# Patient Record
Sex: Female | Born: 1980 | Race: Black or African American | Hispanic: No | Marital: Single | Smoking: Never smoker
Health system: Southern US, Community
[De-identification: ages and names within clinical notes are randomized; demographics above are authoritative.]

## PROBLEM LIST (undated history)

## (undated) DIAGNOSIS — T7840XA Allergy, unspecified, initial encounter: Secondary | ICD-10-CM

## (undated) DIAGNOSIS — F419 Anxiety disorder, unspecified: Secondary | ICD-10-CM

## (undated) HISTORY — DX: Allergy, unspecified, initial encounter: T78.40XA

## (undated) HISTORY — DX: Anxiety disorder, unspecified: F41.9

---

## 2004-10-28 ENCOUNTER — Emergency Department (HOSPITAL_COMMUNITY): Admission: EM | Admit: 2004-10-28 | Discharge: 2004-10-28 | Payer: Self-pay | Admitting: Emergency Medicine

## 2004-11-06 ENCOUNTER — Emergency Department (HOSPITAL_COMMUNITY): Admission: EM | Admit: 2004-11-06 | Discharge: 2004-11-06 | Payer: Self-pay | Admitting: Family Medicine

## 2009-04-10 ENCOUNTER — Emergency Department (HOSPITAL_COMMUNITY)
Admission: EM | Admit: 2009-04-10 | Discharge: 2009-04-10 | Payer: Self-pay | Source: Home / Self Care | Admitting: Family Medicine

## 2011-10-30 ENCOUNTER — Other Ambulatory Visit: Payer: Self-pay | Admitting: Obstetrics & Gynecology

## 2011-10-30 DIAGNOSIS — N63 Unspecified lump in unspecified breast: Secondary | ICD-10-CM

## 2011-10-30 DIAGNOSIS — Z803 Family history of malignant neoplasm of breast: Secondary | ICD-10-CM

## 2011-10-30 DIAGNOSIS — N644 Mastodynia: Secondary | ICD-10-CM

## 2011-11-02 ENCOUNTER — Ambulatory Visit
Admission: RE | Admit: 2011-11-02 | Discharge: 2011-11-02 | Disposition: A | Discharge status: Home / Self Care | Payer: 59 | Source: Ambulatory Visit | Attending: Obstetrics & Gynecology | Admitting: Obstetrics & Gynecology

## 2011-11-02 DIAGNOSIS — N63 Unspecified lump in unspecified breast: Secondary | ICD-10-CM

## 2011-11-02 DIAGNOSIS — Z803 Family history of malignant neoplasm of breast: Secondary | ICD-10-CM

## 2011-11-02 DIAGNOSIS — N644 Mastodynia: Secondary | ICD-10-CM

## 2012-11-11 ENCOUNTER — Other Ambulatory Visit: Payer: Self-pay

## 2012-11-11 DIAGNOSIS — Z1231 Encounter for screening mammogram for malignant neoplasm of breast: Secondary | ICD-10-CM

## 2012-11-25 ENCOUNTER — Ambulatory Visit
Admission: RE | Admit: 2012-11-25 | Discharge: 2012-11-25 | Disposition: A | Discharge status: Home / Self Care | Payer: 59 | Source: Ambulatory Visit

## 2012-11-25 DIAGNOSIS — Z1231 Encounter for screening mammogram for malignant neoplasm of breast: Secondary | ICD-10-CM

## 2015-04-01 ENCOUNTER — Ambulatory Visit (INDEPENDENT_AMBULATORY_CARE_PROVIDER_SITE_OTHER): Payer: 59 | Admitting: Urgent Care

## 2015-04-01 ENCOUNTER — Ambulatory Visit (INDEPENDENT_AMBULATORY_CARE_PROVIDER_SITE_OTHER): Payer: 59

## 2015-04-01 VITALS — BP 118/80 | HR 99 | Temp 98.5°F | Resp 16 | Ht 69.0 in | Wt 142.0 lb

## 2015-04-01 DIAGNOSIS — R0602 Shortness of breath: Secondary | ICD-10-CM | POA: Diagnosis not present

## 2015-04-01 DIAGNOSIS — F419 Anxiety disorder, unspecified: Secondary | ICD-10-CM

## 2015-04-01 NOTE — Progress Notes (Signed)
    MRN: [id number] DOB: 1980/09/26  Subjective:   Joan Lane is a 35 y.o. female presenting for chief complaint of Shortness of Breath  Reports 2 week history of difficulty breathing, shortness of breath, throat congestion, throat tightness. Has not tried any medications. However, patient has had some temporary relief with belching. Admits history of seasonal allergies but has not tried this recently. Also has a history of anxiety but is not on any medical therapy, has not seen a therapist of psychiatrist. She also admits that her shob may be related to anxiety but cannot identify any triggers as her shob occurs randomly. Patient's mother reports that she thinks her anxiety is worse than patient admits. Denies fever, chest pain, wheezing, cough, sore throat, n/v, abdominal pain. Denies smoking cigarettes, history of asthma.  Joan Lane currently has no medications in their medication list. Also is allergic to avelox.  Joan Lane  has a past medical history of Allergy and Anxiety. Also  has no past surgical history on file.  Denies family history of lung cancer, lung disease, sarcoidosis.  Objective:   Vitals: BP 118/80 mmHg  Pulse 99  Temp(Src) 98.5 F (36.9 C) (Oral)  Resp 16  Ht 5\' 9"  (1.753 m)  Wt 142 lb (64.411 kg)  BMI 20.96 kg/m2  SpO2 98%  PF 400 L/min  LMP 03/21/2015 (Exact Date)  Physical Exam  Constitutional: She is oriented to person, place, and time. She appears well-developed and well-nourished.  HENT:  Mouth/Throat: Oropharynx is clear and moist.  Eyes: Right eye exhibits no discharge. Left eye exhibits no discharge. No scleral icterus.  Neck: Normal range of motion. Neck supple.  Cardiovascular: Normal rate, regular rhythm and intact distal pulses.  Exam reveals no gallop and no friction rub.   No murmur heard. Pulmonary/Chest: No respiratory distress. She has no wheezes. She has no rales.  Abdominal: Soft. Bowel sounds are normal. She exhibits no distension and no mass.  There is no tenderness.  Musculoskeletal: She exhibits no edema.  Lymphadenopathy:    She has no cervical adenopathy.  Neurological: She is alert and oriented to person, place, and time.  Skin: Skin is warm and dry. No rash noted. No erythema. No pallor.   [facility] reading (PRIMARY) by  Dr. Merla Richesoolittle and PA-Yisell Sprunger. Chest - normal.  Dg Chest 2 View  04/01/2015  CLINICAL DATA:  Cough EXAM: CHEST  2 VIEW COMPARISON:  None. FINDINGS: Lungs are clear. Heart size and pulmonary vascularity are normal. No adenopathy. No bone lesions. IMPRESSION: No abnormality noted. Electronically Signed   By: Bretta BangWilliam  Woodruff III M.D.   On: 04/01/2015 17:44   Assessment and Plan :   This case was precepted with Dr. Merla Richesoolittle.   1. Shortness of breath 2. Anxiety - Physical exam, x-ray findings, peak flow is very reassuring. Discussed differential with patient and suggested that she may need SSRI therapy. However, patient would like to have formal pulmonary function testing with a pulmonologist. Referral pending.  Wallis BambergMario Abdurahman Rugg, PA-C [facility] [phone] 04/01/2015 4:10 PM

## 2015-04-01 NOTE — Patient Instructions (Signed)

## 2015-04-28 ENCOUNTER — Encounter: Payer: Self-pay | Admitting: Internal Medicine

## 2015-04-28 ENCOUNTER — Other Ambulatory Visit (INDEPENDENT_AMBULATORY_CARE_PROVIDER_SITE_OTHER): Payer: 59

## 2015-04-28 ENCOUNTER — Ambulatory Visit (INDEPENDENT_AMBULATORY_CARE_PROVIDER_SITE_OTHER): Payer: 59 | Admitting: Internal Medicine

## 2015-04-28 ENCOUNTER — Other Ambulatory Visit: Payer: Self-pay | Admitting: Internal Medicine

## 2015-04-28 VITALS — BP 118/72 | HR 56 | Ht 68.5 in | Wt 143.0 lb

## 2015-04-28 DIAGNOSIS — R0600 Dyspnea, unspecified: Secondary | ICD-10-CM | POA: Insufficient documentation

## 2015-04-28 LAB — CBC WITH DIFFERENTIAL/PLATELET
BASOS PCT: 0.9 % (ref 0.0–3.0)
Basophils Absolute: 0 10*3/uL (ref 0.0–0.1)
EOS PCT: 6.4 % — AB (ref 0.0–5.0)
Eosinophils Absolute: 0.3 10*3/uL (ref 0.0–0.7)
HEMATOCRIT: 42.9 % (ref 36.0–46.0)
HEMOGLOBIN: 14 g/dL (ref 12.0–15.0)
LYMPHS PCT: 33.9 % (ref 12.0–46.0)
Lymphs Abs: 1.5 10*3/uL (ref 0.7–4.0)
MCHC: 32.7 g/dL (ref 30.0–36.0)
MCV: 92.9 fl (ref 78.0–100.0)
MONO ABS: 0.6 10*3/uL (ref 0.1–1.0)
Monocytes Relative: 13.3 % — ABNORMAL HIGH (ref 3.0–12.0)
Neutro Abs: 2 10*3/uL (ref 1.4–7.7)
Neutrophils Relative %: 45.5 % (ref 43.0–77.0)
Platelets: 310 10*3/uL (ref 150.0–400.0)
RBC: 4.62 Mil/uL (ref 3.87–5.11)
RDW: 12.2 % (ref 11.5–15.5)
WBC: 4.3 10*3/uL (ref 4.0–10.5)

## 2015-04-28 LAB — BASIC METABOLIC PANEL
BUN: 12 mg/dL (ref 6–23)
CHLORIDE: 103 meq/L (ref 96–112)
CO2: 30 mEq/L (ref 19–32)
Calcium: 10 mg/dL (ref 8.4–10.5)
Creatinine, Ser: 1.03 mg/dL (ref 0.40–1.20)
GFR: 79.59 mL/min (ref >60.00)
Glucose, Bld: 92 mg/dL (ref 70–99)
POTASSIUM: 4.5 meq/L (ref 3.5–5.1)
SODIUM: 138 meq/L (ref 135–145)

## 2015-04-28 LAB — NITRIC OXIDE: NITRIC OXIDE: 18

## 2015-04-28 LAB — TSH: TSH: 1.32 u[IU]/mL (ref 0.35–4.50)

## 2015-04-28 NOTE — Progress Notes (Signed)
Subjective:    Patient ID: Joan Lane, female    DOB: [birth year],    MRN: [id number]  HPI  75 yobf never smoker while sitting on couch grieving over dog's death on  Apr 06, 2015  = acutely aware episodice sob at rest and variably with heavy exercise but not usually while sleeping > Joan Lane eval at [facility] neg > referred to pulmonary clinic 04/28/2015     04/28/2015 1st [facility] office visit/ Joan Lane   Chief Complaint  Patient presents with  . Pulmonary Consult    Referred by Dr. Urban Gibson. Pt c/o SOB off and on for the past 6 wks. She states it can occue in the am when she wakes up and sometimes at night when she lies down. She has also had occ heartburn and diff with swallowing.   onset of rhinitis   as a child year round rx zyrtec hs but never allergy tested Also has intermittent HB and does breath better sometimes p belches Able to play full court BB s problem with sob or cp  No obvious  patterns in day to day or daytime variabilty or assoc chronic cough or cp or chest tightness, subjective wheeze or overt sinus  symptoms. No unusual exp hx or h/o childhood pna/ asthma or knowledge of premature birth.  Sleeping ok without nocturnal  or early am exacerbation  of respiratory  c/o's or need for noct saba. Also denies any obvious fluctuation of symptoms with weather or environmental changes or other aggravating or alleviating factors except as outlined above   Current Medications, Allergies, Complete Past Medical History, Past Surgical History, Family History, and Social History were reviewed in [employer] record.            Review of Systems  Constitutional: Negative for fever, chills and unexpected weight change.  HENT: Positive for trouble swallowing. Negative for congestion, dental problem, ear pain, nosebleeds, postnasal drip, rhinorrhea, sinus pressure, sneezing, sore throat and voice change.   Eyes: Negative for visual disturbance.  Respiratory: Positive for  shortness of breath. Negative for cough and choking.   Cardiovascular: Negative for chest pain and leg swelling.  Gastrointestinal: Negative for vomiting, abdominal pain and diarrhea.  Genitourinary: Negative for difficulty urinating.       Acid heartburn  Musculoskeletal: Negative for arthralgias.  Skin: Negative for rash.  Neurological: Negative for tremors, syncope and headaches.  Hematological: Does not bruise/bleed easily.       Objective:   Physical Exam   amb bf nad   Wt Readings from Last 3 Encounters:  04/28/15 143 lb (64.864 kg)  04/01/15 142 lb (64.411 kg)    Vital signs reviewed    HEENT: nl dentition, turbinates, and oropharynx. Nl external ear canals without cough reflex   NECK :  without JVD/Nodes/TM/ nl carotid upstrokes bilaterally   LUNGS: no acc muscle use,  Nl contour chest which is clear to A and P bilaterally without cough on insp or exp maneuvers   CV:  RRR  no s3 or murmur or increase in P2, no edema   ABD:  soft and nontender with nl inspiratory excursion in the supine position. No bruits or organomegaly, bowel sounds nl  MS:  Nl gait/ ext warm without deformities, calf tenderness, cyanosis or clubbing No obvious joint restrictions   SKIN: warm and dry without lesions    NEURO:  alert, approp, nl sensorium with  no motor deficits     I personally reviewed images and  agree with radiology impression as follows:  CXR: 04/01/15 No abnormality noted.  Labs ordered/ reviewed:      Chemistry      Component Value Date/Time   NA 138 04/28/2015 1226   K 4.5 04/28/2015 1226   CL 103 04/28/2015 1226   CO2 30 04/28/2015 1226   BUN 12 04/28/2015 1226   CREATININE 1.03 04/28/2015 1226      Component Value Date/Time   CALCIUM 10.0 04/28/2015 1226        Lab Results  Component Value Date   WBC 4.3 04/28/2015   HGB 14.0 04/28/2015   HCT 42.9 04/28/2015   MCV 92.9 04/28/2015   PLT 310.0 04/28/2015         Lab Results  Component  Value Date   TSH 1.32 04/28/2015          Assessment & Plan:

## 2015-04-28 NOTE — Patient Instructions (Addendum)
Try prilosec otc   Take 30-60 min before first meal of the day and Pepcid ac (famotidine) 20 mg one @  bedtime  X 4 weeks to see if you note any changes in your symptoms  GERD (REFLUX)  is an extremely common cause of respiratory symptoms just like yours , many times with no obvious heartburn at all.    It can be treated with medication, but also with lifestyle changes including elevation of the head of your bed (ideally with 6 inch  bed blocks),  Smoking cessation, avoidance of late meals, excessive alcohol, and avoid fatty foods, chocolate, peppermint, colas, red wine, and acidic juices such as orange juice.  NO MINT OR MENTHOL PRODUCTS SO NO COUGH DROPS  USE SUGARLESS CANDY INSTEAD (Jolley ranchers or Stover's or Life Savers) or even ice chips will also do - the key is to swallow to prevent all throat clearing. NO OIL BASED VITAMINS - use powdered substitutes.  Please remember to go to the lab  department downstairs for your tests - we will call you with the results when they are available.  Call back in 4 weeks if not better and we can schedule you a CPST

## 2015-04-29 NOTE — Assessment & Plan Note (Addendum)
Onset Mar 05 2016 with dog's death notable more at rest than ex  - Spirometry 04/28/2015  wnl  - NO 04/28/2015   = 18 ruling out active allergic asthma     Symptoms are markedly disproportionate to objective findings and not clear this is a lung problem but pt does appear to have difficult airway management issues. DDX of  difficult airways management almost all start with A and  include Adherence, Ace Inhibitors, Acid Reflux, Active Sinus Disease, Alpha 1 Antitripsin deficiency, Anxiety masquerading as Airways dz,  ABPA,  Allergy(esp in young), Aspiration (esp in elderly), Adverse effects of meds,  Active smokers, A bunch of PE's (a small clot burden can't cause this syndrome unless there is already severe underlying pulm or vascular dz with poor reserve) plus two Bs  = Bronchiectasis and Beta blocker use..and one C= CHF   ? Acid (or non-acid) GERD > always difficult to exclude as up to 75% of pts in some series report no assoc GI/ Heartburn symptoms> rec max (24h)  acid suppression and diet restrictions/ reviewed and instructions given in writing.   ? Anxiety > usually at the bottom of this list of usual suspects but should be much higher on this pt's based on H and P and note onset was at death of beloved pet, one of life's bigger stessors > f/u pcp  ? Allergy/asthma suggested by h/o rhinitis but note does not typically occur with ex/ cold exp or assoc with cough /subjective wheeze so will hold off on further testing until see how she responds to gerd rx  ? chf > excluded with BNP< 100  I had an extended discussion with the patient and domestic partner reviewing all relevant studies completed to date and  lasting 35  minutes of a 60 minute visit    Each maintenance medication was reviewed in detail including most importantly the difference between maintenance and prns and under what circumstances the prns are to be triggered using an action plan format that is not reflected in the computer  generated alphabetically organized AVS.    Please see instructions for details which were reviewed in writing and the patient given a copy highlighting the part that I personally wrote and discussed at today's ov.

## 2015-05-02 LAB — RESPIRATORY ALLERGY PROFILE REGION II ~~LOC~~
ALLERGEN, OAK, T7: 5.8 kU/L — AB
ALTERNARIA ALTERNATA: 5.57 kU/L — AB
Allergen, Cedar tree, t12: 2.01 kU/L — ABNORMAL HIGH
Allergen, Comm Silver Birch, t9: 2.21 kU/L — ABNORMAL HIGH
Allergen, Cottonwood, t14: 0.23 kU/L — ABNORMAL HIGH
Allergen, Mouse Urine Protein, e78: <0.1 kU/L
BOX ELDER: 0.31 kU/L — AB
Bermuda Grass: 1.3 kU/L — ABNORMAL HIGH
CAT DANDER: 0.15 kU/L — AB
Common Ragweed: 2.99 kU/L — ABNORMAL HIGH
Dog Dander: 2.28 kU/L — ABNORMAL HIGH
Elm IgE: 0.32 kU/L — ABNORMAL HIGH
IgE (Immunoglobulin E), Serum: 142 kU/L — ABNORMAL HIGH (ref <115)
JOHNSON GRASS: 1.79 kU/L — AB
PECAN/HICKORY TREE IGE: 0.36 kU/L — AB
Penicillium Notatum: <0.1 kU/L
ROUGH PIGWEED IGE: 0.3 kU/L — AB
SHEEP SORREL IGE: 0.29 kU/L — AB
TIMOTHY GRASS: 5.71 kU/L — AB

## 2015-05-05 ENCOUNTER — Telehealth: Payer: Self-pay | Admitting: Internal Medicine

## 2015-05-05 NOTE — Progress Notes (Signed)
Quick Note:  LMTCB ______ 

## 2015-05-05 NOTE — Telephone Encounter (Signed)
Spoke with pt, aware of results/recs.  Nothing further needed.  

## 2015-05-05 NOTE — Progress Notes (Signed)
Quick Note:  Pt aware of results. See 05/05/15 phone note. ______

## 2015-05-05 NOTE — Telephone Encounter (Signed)
All nl except slt elevation of eos c/w allergies as reflected on the allergy profile

## 2015-05-05 NOTE — Telephone Encounter (Signed)
Notes Recorded by Nyoka Cowden, MD on 05/02/2015 at 4:18 PM Call patient : Studies are c/w significant allergies to ragweed, oak, cedar, grass, dog  -------  Spoke with pt.  Discussed above results and recs per Dr. Sherene Sires.  Pt verbalized understanding.  Dr. Sherene Sires, pt request results of TSH, BMET, and CBC, please advise.  Thank you.

## 2016-07-09 IMAGING — CR DG CHEST 2V
2 series · 2 of 2 positions shown · non-contrast
Comparison: None.

CLINICAL DATA: Cough

EXAM:
CHEST  2 VIEW

[PA]
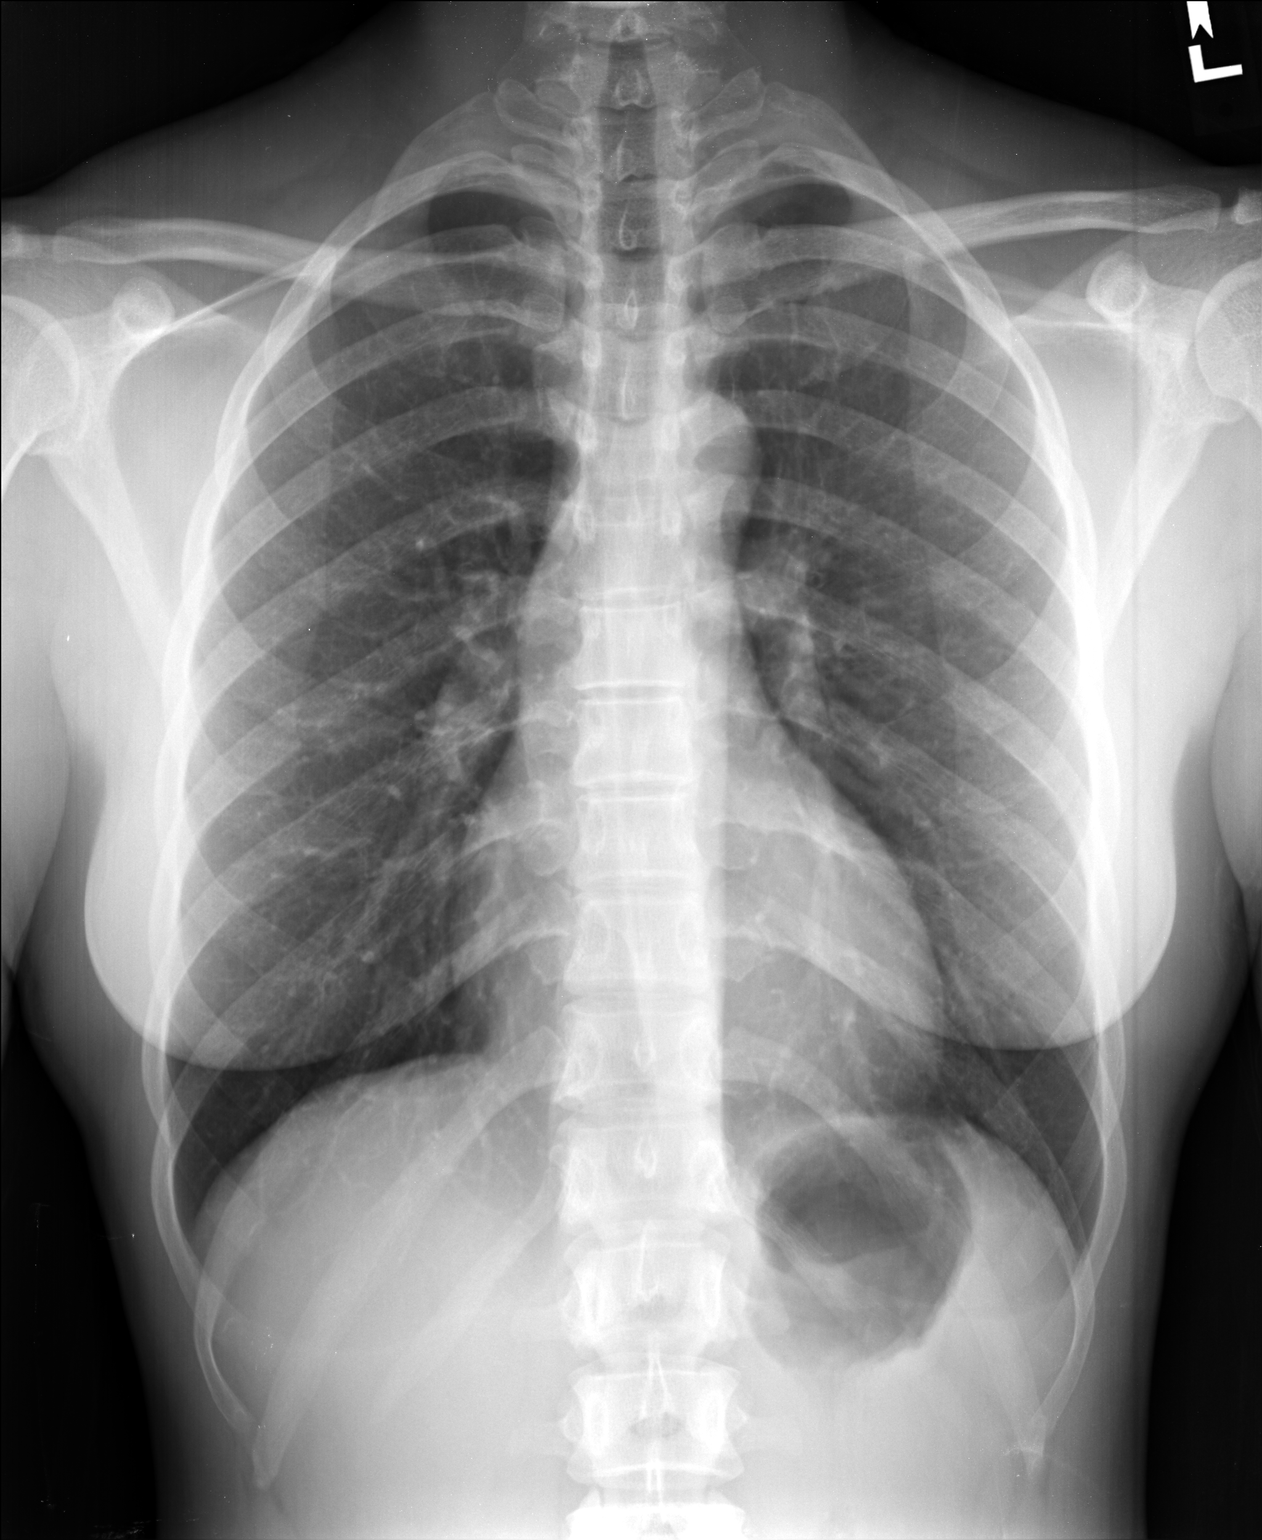

[lateral]
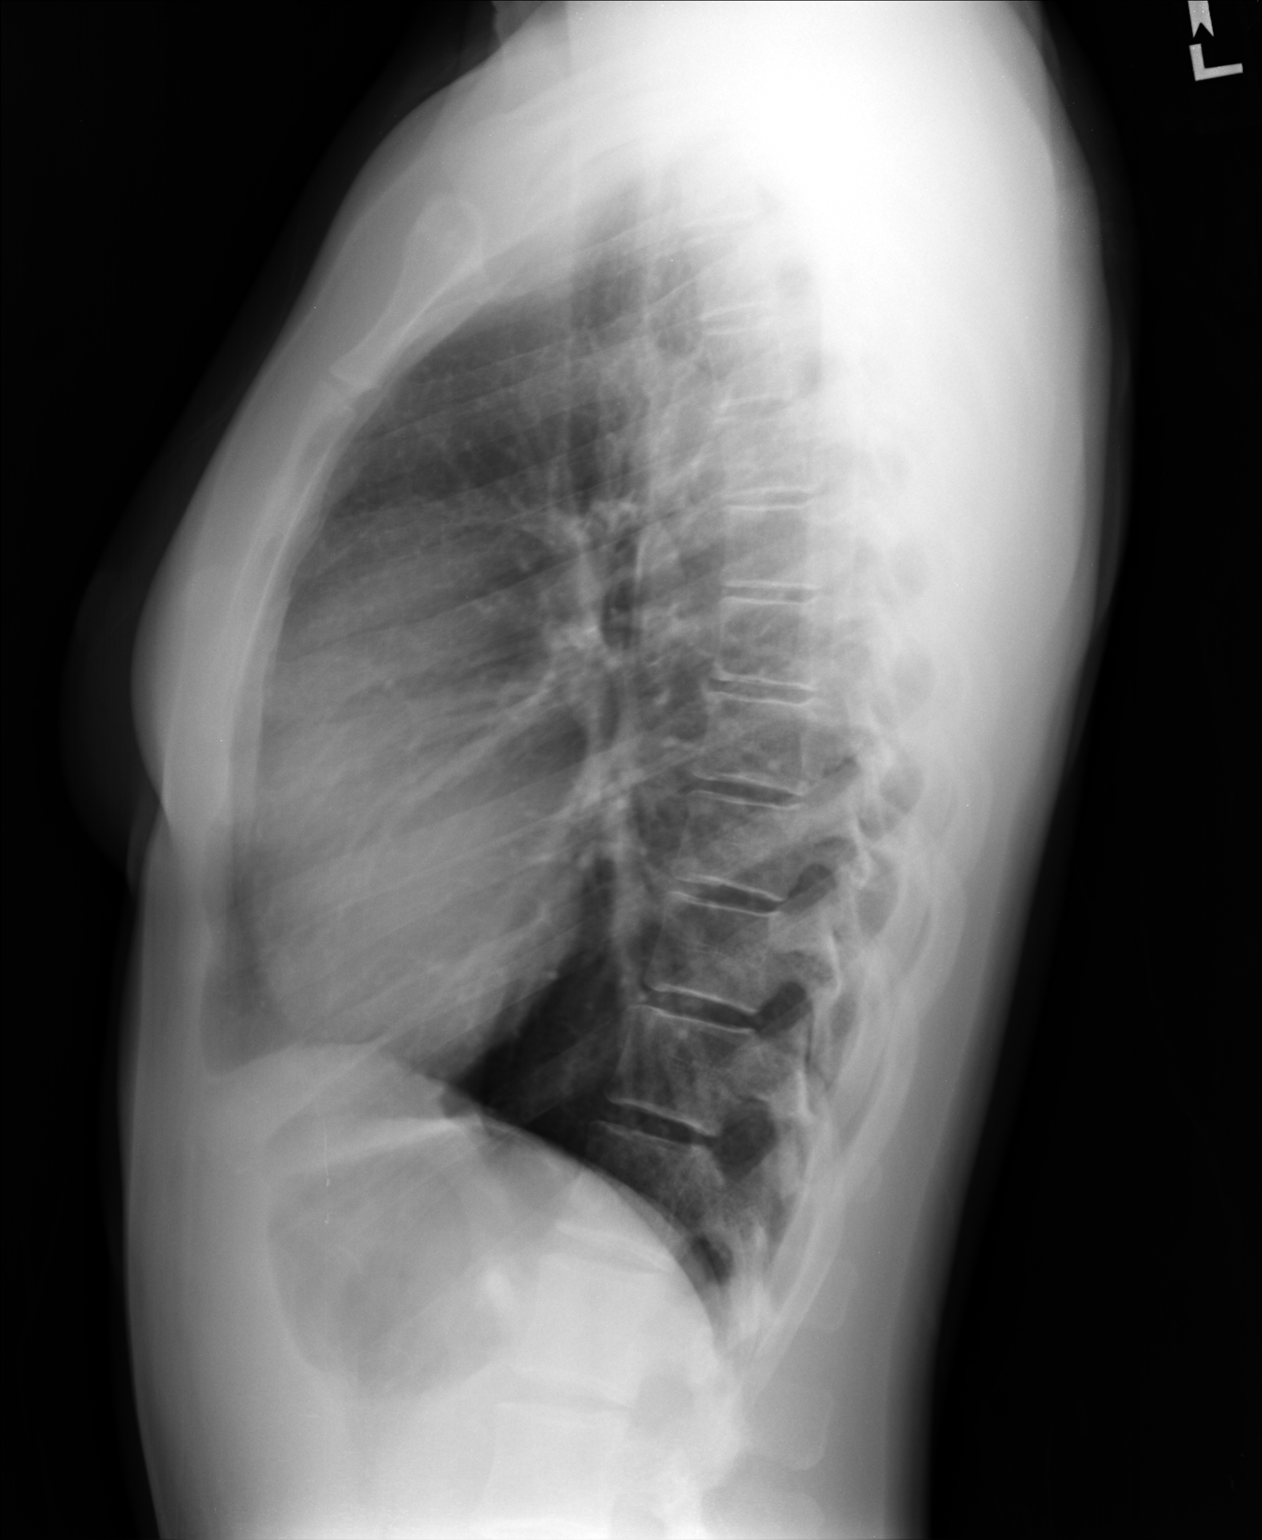

[2 of 2 positions shown; findings below may reference images not displayed]

FINDINGS: Lungs are clear. Heart size and pulmonary vascularity are normal. No
adenopathy. No bone lesions.
IMPRESSION: No abnormality noted.
